# Patient Record
Sex: Male | Born: 1945 | Race: White | Hispanic: No | Marital: Single
Health system: Southern US, Community
[De-identification: ages and names within clinical notes are randomized; demographics above are authoritative.]

---

## 2010-12-08 ENCOUNTER — Ambulatory Visit: Payer: Self-pay

## 2011-04-25 ENCOUNTER — Ambulatory Visit: Payer: Self-pay

## 2020-06-26 ENCOUNTER — Other Ambulatory Visit: Payer: Self-pay

## 2020-06-26 ENCOUNTER — Emergency Department (HOSPITAL_COMMUNITY)
Admission: EM | Admit: 2020-06-26 | Discharge: 2020-06-26 | Disposition: A | Discharge status: Home / Self Care | Payer: Medicare Other | Attending: Emergency Medicine | Admitting: Emergency Medicine

## 2020-06-26 ENCOUNTER — Emergency Department (HOSPITAL_COMMUNITY): Payer: Medicare Other

## 2020-06-26 DIAGNOSIS — S0990XA Unspecified injury of head, initial encounter: Secondary | ICD-10-CM

## 2020-06-26 DIAGNOSIS — W108XXA Fall (on) (from) other stairs and steps, initial encounter: Secondary | ICD-10-CM

## 2020-06-26 DIAGNOSIS — T148XXA Other injury of unspecified body region, initial encounter: Secondary | ICD-10-CM

## 2020-06-26 DIAGNOSIS — S60511A Abrasion of right hand, initial encounter: Secondary | ICD-10-CM | POA: Insufficient documentation

## 2020-06-26 DIAGNOSIS — Y9301 Activity, walking, marching and hiking: Secondary | ICD-10-CM | POA: Diagnosis not present

## 2020-06-26 DIAGNOSIS — Z7901 Long term (current) use of anticoagulants: Secondary | ICD-10-CM | POA: Diagnosis not present

## 2020-06-26 DIAGNOSIS — S0181XA Laceration without foreign body of other part of head, initial encounter: Secondary | ICD-10-CM | POA: Diagnosis not present

## 2020-06-26 LAB — COMPREHENSIVE METABOLIC PANEL
ALT: 41 U/L (ref 0–44)
AST: 74 U/L — ABNORMAL HIGH (ref 15–41)
Albumin: 3 g/dL — ABNORMAL LOW (ref 3.5–5.0)
Alkaline Phosphatase: 125 U/L (ref 38–126)
Anion gap: 5 (ref 5–15)
BUN: 22 mg/dL (ref 8–23)
CO2: 23 mmol/L (ref 22–32)
Calcium: 9 mg/dL (ref 8.9–10.3)
Chloride: 105 mmol/L (ref 98–111)
Creatinine, Ser: 1.17 mg/dL (ref 0.61–1.24)
GFR, Estimated: >60 mL/min (ref >60)
Glucose, Bld: 92 mg/dL (ref 70–99)
Potassium: 4.9 mmol/L (ref 3.5–5.1)
Sodium: 133 mmol/L — ABNORMAL LOW (ref 135–145)
Total Bilirubin: 1.5 mg/dL — ABNORMAL HIGH (ref 0.3–1.2)
Total Protein: 5.2 g/dL — ABNORMAL LOW (ref 6.5–8.1)

## 2020-06-26 LAB — CBC WITH DIFFERENTIAL/PLATELET
Abs Immature Granulocytes: 0.1 10*3/uL — ABNORMAL HIGH (ref 0.00–0.07)
Basophils Absolute: 0.1 10*3/uL (ref 0.0–0.1)
Basophils Relative: 1 %
Eosinophils Absolute: 0.3 10*3/uL (ref 0.0–0.5)
Eosinophils Relative: 3 %
HCT: 28.5 % — ABNORMAL LOW (ref 39.0–52.0)
Hemoglobin: 9.6 g/dL — ABNORMAL LOW (ref 13.0–17.0)
Immature Granulocytes: 1 %
Lymphocytes Relative: 10 %
Lymphs Abs: 0.9 10*3/uL (ref 0.7–4.0)
MCH: 32 pg (ref 26.0–34.0)
MCHC: 33.7 g/dL (ref 30.0–36.0)
MCV: 95 fL (ref 80.0–100.0)
Monocytes Absolute: 1.4 10*3/uL — ABNORMAL HIGH (ref 0.1–1.0)
Monocytes Relative: 14 %
Neutro Abs: 6.8 10*3/uL (ref 1.7–7.7)
Neutrophils Relative %: 71 %
Platelets: 133 10*3/uL — ABNORMAL LOW (ref 150–400)
RBC: 3 MIL/uL — ABNORMAL LOW (ref 4.22–5.81)
RDW: 19.2 % — ABNORMAL HIGH (ref 11.5–15.5)
WBC: 9.5 10*3/uL (ref 4.0–10.5)
nRBC: 0 % (ref 0.0–0.2)

## 2020-06-26 LAB — PROTIME-INR
INR: 2.5 — ABNORMAL HIGH (ref 0.8–1.2)
Prothrombin Time: 25.9 seconds — ABNORMAL HIGH (ref 11.4–15.2)

## 2020-06-26 NOTE — Discharge Instructions (Signed)
Your work-up today included imaging that did not show any acute abnormalities in your head and neck from the trauma.  It did show a thyroid nodule which we discussed.  Please follow-up with your primary team to discuss outpatient ultrasound.  You have a skin tears and abrasions from the fall but otherwise no injuries that would be amenable to suture repair for lacerations.  Please keep the wounds clean and dry and watch for development of infection.  Please follow-up with your primary doctor.  If any symptoms change or worsen acutely, please return to the nearest emergency department.

## 2020-06-26 NOTE — ED Triage Notes (Signed)
Pt BIB [facility]. Pt was walking down steps with walker, fell down 4 steps and hit the front of his head. Pt takes Warfarin. Pt lost consciousness for approximately 1 minute. Denies neck pain, EMS attempted C-collar, pt refused. Pt c/o headache, has a hematoma to top L forehead. Pt was projectile vomiting on scene and in route. Received 4mg  IV zofran.   EMS VS- 125/47, HR 85, SpO2 97% RA, RR 14, CBG 144

## 2020-06-26 NOTE — ED Notes (Signed)
Pt verbalizes understanding of discharge instructions. Opportunity for questions and answers were provided. Pt discharged from the ED.   ?

## 2020-06-26 NOTE — Progress Notes (Signed)
Orthopedic Tech Progress Note Patient Details:  Dustin Cannon 1946/03/13 [id number] Level 2 trauma Patient ID: Dustin Cannon, male   DOB: 1945/10/23, 75 y.o.   MRN: [id number]   Michelle Piper 06/26/2020, 10:49 AM

## 2020-06-26 NOTE — ED Notes (Signed)
Returned from CT.

## 2020-06-26 NOTE — ED Provider Notes (Signed)
[facility] EMERGENCY DEPARTMENT Provider Note   CSN: [id number] Arrival date & time: 06/26/20  1020     History Chief Complaint  Patient presents with  . Fall    Dustin Cannon is a 75 y.o. male.  The history is provided by the patient, medical records and the EMS personnel. No language interpreter was used.  Fall This is a new problem. The current episode started 1 to 2 hours ago. The problem occurs rarely. Associated symptoms include headaches. Pertinent negatives include no chest pain, no abdominal pain and no shortness of breath. Nothing aggravates the symptoms. Nothing relieves the symptoms. He has tried nothing for the symptoms. The treatment provided no relief.       No past medical history on file.  There are no problems to display for this patient.      No family history on file.     Home Medications Prior to Admission medications   Not on File    Allergies    Patient has no allergy information on record.  Review of Systems   Review of Systems  Constitutional: Negative for chills, diaphoresis, fatigue and fever.  HENT: Negative for congestion.   Eyes: Negative for visual disturbance.  Respiratory: Negative for cough, chest tightness and shortness of breath.   Cardiovascular: Negative for chest pain.  Gastrointestinal: Positive for abdominal distention (at baseline). Negative for abdominal pain, constipation, diarrhea, nausea and vomiting.  Genitourinary: Negative for flank pain and frequency.  Musculoskeletal: Negative for back pain, neck pain and neck stiffness.  Neurological: Positive for headaches. Negative for dizziness, seizures, speech difficulty, weakness, light-headedness and numbness.  Psychiatric/Behavioral: Negative for agitation and confusion.  All other systems reviewed and are negative.   Physical Exam Updated Vital Signs There were no vitals taken for this visit.  Physical Exam Vitals and nursing note  reviewed.  Constitutional:      General: He is not in acute distress.    Appearance: He is well-developed. He is not ill-appearing, toxic-appearing or diaphoretic.  HENT:     Mouth/Throat:     Mouth: Mucous membranes are moist.     Pharynx: No oropharyngeal exudate or posterior oropharyngeal erythema.  Eyes:     Extraocular Movements: Extraocular movements intact.     Conjunctiva/sclera: Conjunctivae normal.     Pupils: Pupils are equal, round, and reactive to light.  Cardiovascular:     Rate and Rhythm: Normal rate and regular rhythm.     Heart sounds: No murmur heard.   Pulmonary:     Effort: Pulmonary effort is normal. No respiratory distress.     Breath sounds: Normal breath sounds. No wheezing, rhonchi or rales.  Chest:     Chest wall: No tenderness.  Abdominal:     General: There is distension.     Palpations: Abdomen is soft.     Tenderness: There is no abdominal tenderness. There is no right CVA tenderness, left CVA tenderness, guarding or rebound.     Hernia: A hernia is present.  Musculoskeletal:        General: Signs of injury (abrasion) present. No tenderness.     Cervical back: Neck supple.     Left lower leg: No edema.     Comments: Abrasion to right hand without significant pain.  Normal grip strength, sensation, pulses in extremities.  Skin:    General: Skin is warm and dry.     Capillary Refill: Capillary refill takes less than 2 seconds.     Findings:  Bruising (chronis oer pt) present. No erythema or rash.  Neurological:     General: No focal deficit present.     Mental Status: He is alert.     Sensory: No sensory deficit.     Motor: No weakness.  Psychiatric:        Mood and Affect: Mood normal.     ED Results / Procedures / Treatments   Labs (all labs ordered are listed, but only abnormal results are displayed) Labs Reviewed  CBC WITH DIFFERENTIAL/PLATELET - Abnormal; Notable for the following components:      Result Value   RBC 3.00 (*)     Hemoglobin 9.6 (*)    HCT 28.5 (*)    RDW 19.2 (*)    Platelets 133 (*)    Monocytes Absolute 1.4 (*)    Abs Immature Granulocytes 0.10 (*)    All other components within normal limits  COMPREHENSIVE METABOLIC PANEL - Abnormal; Notable for the following components:   Sodium 133 (*)    Total Protein 5.2 (*)    Albumin 3.0 (*)    AST 74 (*)    Total Bilirubin 1.5 (*)    All other components within normal limits  PROTIME-INR - Abnormal; Notable for the following components:   Prothrombin Time 25.9 (*)    INR 2.5 (*)    All other components within normal limits    EKG EKG Interpretation  Date/Time:  Friday June 26 2020 10:30:35 EST Ventricular Rate:  82 PR Interval:    QRS Duration: 87 QT Interval:  379 QTC Calculation: 443 R Axis:   48 Text Interpretation: Sinus rhythm No prior ECG for comparison. No STEMI Confirmed by Theda Belfast ([id number]) on 06/26/2020 10:34:21 AM   Radiology CT Head Wo Contrast  Result Date: 06/26/2020 CLINICAL DATA:  Larey Seat today, hitting his forehead on concrete. Left forehead abrasion. Denies neck pain or loss of consciousness. EXAM: CT HEAD WITHOUT CONTRAST CT CERVICAL SPINE WITHOUT CONTRAST TECHNIQUE: Multidetector CT imaging of the head and cervical spine was performed following the standard protocol without intravenous contrast. Multiplanar CT image reconstructions of the cervical spine were also generated. COMPARISON:  None. FINDINGS: CT HEAD FINDINGS Brain: No evidence of acute infarction, hemorrhage, hydrocephalus, extra-axial collection or mass lesion/mass effect. Right frontal lobe encephalomalacia. Mild generalized cerebral atrophy. Vascular: No hyperdense vessel or unexpected calcification. Skull: Prior right frontal craniectomy. Negative for fracture or focal lesion. Sinuses/Orbits: No acute finding. Other: Small left frontal scalp hematoma. CT CERVICAL SPINE FINDINGS Alignment: Slight reversal of the normal cervical lordosis. No traumatic  malalignment. Skull base and vertebrae: No acute fracture. No primary bone lesion or focal pathologic process. Soft tissues and spinal canal: No prevertebral fluid or swelling. No visible canal hematoma. Disc levels: Moderate to severe disc height loss throughout the cervical spine, sparing C3-C4. Advanced bilateral uncovertebral hypertrophy at C5-C6 and C6-C7. Ankylosis of the left C4-C5 facet joint. Upper chest: Mild centrilobular emphysema. Small amount of secretions within the trachea. Other: 2.2 cm heterogeneous nodule in the posterior left thyroid lobe (series 9, image 77). IMPRESSION: CT head: 1. No acute intracranial abnormality. Small left frontal scalp hematoma. 2. 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 No acute cervical spine fracture or traumatic listhesis. 2. 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 3. 2.2 cm heterogeneous nodule in the posterior left thyroid lobe. Recommend thyroid US (ref: J Am Coll Radiol. 2015 Feb;12(2): 143-50). 4. Emphysema (ICD10-J43.9). Electronically Signed   By: Chrissie Noa  Howell Pringle Derry M.D.   On: 06/26/2020 11:41   CT Cervical Spine Wo Contrast  Result Date: 06/26/2020 CLINICAL DATA:  Larey SeatFell today, hitting his forehead on concrete. Left forehead abrasion. Denies neck pain or loss of consciousness. EXAM: CT HEAD WITHOUT CONTRAST CT CERVICAL SPINE WITHOUT CONTRAST TECHNIQUE: Multidetector CT imaging of the head and cervical spine was performed following the standard protocol without intravenous contrast. Multiplanar CT image reconstructions of the cervical spine were also generated. COMPARISON:  None. FINDINGS: CT HEAD FINDINGS Brain: No evidence of acute infarction, hemorrhage, hydrocephalus, extra-axial collection or mass lesion/mass effect. Right frontal lobe encephalomalacia. Mild generalized cerebral atrophy. Vascular: No hyperdense vessel or unexpected calcification. Skull: Prior right frontal  craniectomy. Negative for fracture or focal lesion. Sinuses/Orbits: No acute finding. Other: Small left frontal scalp hematoma. CT CERVICAL SPINE FINDINGS Alignment: Slight reversal of the normal cervical lordosis. No traumatic malalignment. Skull base and vertebrae: No acute fracture. No primary bone lesion or focal pathologic process. Soft tissues and spinal canal: No prevertebral fluid or swelling. No visible canal hematoma. Disc levels: Moderate to severe disc height loss throughout the cervical spine, sparing C3-C4. Advanced bilateral uncovertebral hypertrophy at C5-C6 and C6-C7. Ankylosis of the left C4-C5 facet joint. Upper chest: Mild centrilobular emphysema. Small amount of secretions within the trachea. Other: 2.2 cm heterogeneous nodule in the posterior left thyroid lobe (series 9, image 77). IMPRESSION: CT head: 1. No acute intracranial abnormality. Small left frontal scalp hematoma. 2. 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 No acute cervical spine fracture or traumatic listhesis. 2. 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 3. 2.2 cm heterogeneous nodule in the posterior left thyroid lobe. Recommend thyroid US (ref: J Am Coll Radiol. 2015 Feb;12(2): 143-50). 4. Emphysema (ICD10-J43.9). Electronically Signed   By: Obie DredgeWilliam T Derry M.D.   On: 06/26/2020 11:41    Procedures Procedures   Medications Ordered in ED Medications - No data to display  ED Course  I have reviewed the triage vital signs and the nursing notes.  Pertinent labs & imaging results that were available during my care of the patient were reviewed by me and considered in my medical decision making (see chart for details).    MDM Rules/Calculators/A&P                          Dustin Snooksimothy Thal is a 75 y.o. 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  According to EMS, patient fell down approximately 3 stairs and struck the front of his head on the ground.  He was allegedly unconscious for a minute or 2 and then when EMS saw him, he was projectile vomiting and transfer.  He was given 4 of Zofran and has had improvement in nausea.  He is reporting mild to moderate headache.  He denies other complaints including no extremity symptoms with numbness, tingling, weakness.  Denies any speech difficulties or vision changes.  He is unsure if his tetanus is up-to-date.  We will try to look through the chart to determine.  Patient has some bleeding on his forehead.  He has no complaint of severe neck pain, chest pain, back pain or abdominal pain.  On arrival, airway is intact and breath sounds are equal bilaterally.  Chest and abdomen are nontender.  Neck is nontender.  Back is  nontender.  Patient has some bruising on extremities and has a skin tear/abrasion on his forehead and upper scalp.  Pupils are symmetric and reactive normal extraocular movements.  Speech is clear.  Normal sensation and strength in extremities.  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  It is reducible and nontender.  Normal bowel sounds.  Patient also has a surgical dressing on his right upper quadrant that appears to be intact and well-appearing.  No back tenderness or flank tenderness.  We will get a CT head and neck to evaluate for intracranial bleeding or injury.  We will get screening labs including an INR.  Anticipate reassessment for work-up.  Imaging was reassuring.  Labs do not appear to show any acute abnormalities that require intervention at this time.  Patient was told about a thyroid nodule that was discovered.  He can follow-up with his PCP for this for ultrasound.  Patient agrees.  He was reassessed after cleaning his scalp wound and it appears to be a abrasion with no areas that would be amenable to suture  placement.  Patient will follow up with his PCP and understood return precautions.  He noted questions or concerns and was discharged in good condition.   Final Clinical Impression(s) / ED Diagnoses Final diagnoses:  Abrasion  Fall down stairs, initial encounter  Scalp injury, initial encounter  Multiple skin tears    Rx / DC Orders ED Discharge Orders    None     . Clinical Impression: 1. Abrasion   2. Fall down stairs, initial encounter   3. Scalp injury, initial encounter   4. Multiple skin tears     Disposition: Discharge  Condition: Good  I have discussed the results, Dx and Tx plan with the pt(& family if present). He/she/they expressed understanding and agree(s) with the plan. Discharge instructions discussed at great length. Strict return precautions discussed and pt &/or family have verbalized understanding of the instructions. No further questions at time of discharge.    New Prescriptions   No medications on file    Follow Up: Your primary team     [facility] EMERGENCY DEPARTMENT [address] [id number] mc [place] [address] [phone]       Valerio Pinard, Canary Brim, MD 06/26/20 [phone]

## 2020-06-26 NOTE — ED Notes (Signed)
Patient transported to CT 

## 2020-10-16 DEATH — deceased

## 2022-02-28 IMAGING — CT CT HEAD W/O CM
3 series · 14 of 47 positions shown, 16 images · non-contrast
Comparison: None.

CLINICAL DATA: Fell today, hitting his forehead on concrete. Left
forehead abrasion. Denies neck pain or loss of consciousness.

EXAM:
CT HEAD WITHOUT CONTRAST
CT CERVICAL SPINE WITHOUT CONTRAST
TECHNIQUE: Multidetector CT imaging of the head and cervical spine was
performed following the standard protocol without intravenous
contrast. Multiplanar CT image reconstructions of the cervical spine
were also generated.

[Series 3: head 5.0 h30s · axial · 0.48mm/px · z∈[-126,+9]mm · 8 of 33 slices shown, 10 images]
[im 3/33  brain]
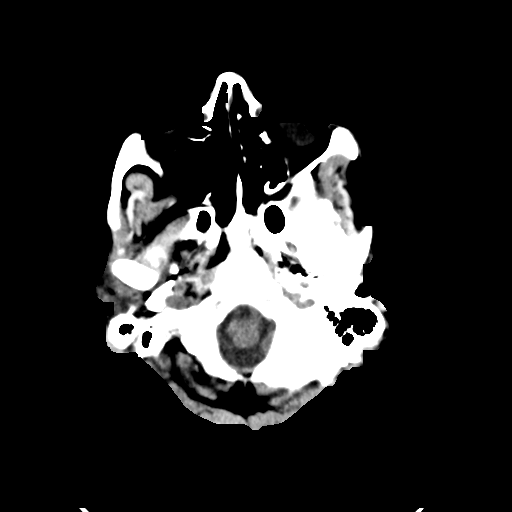
[im 3/33  bone]
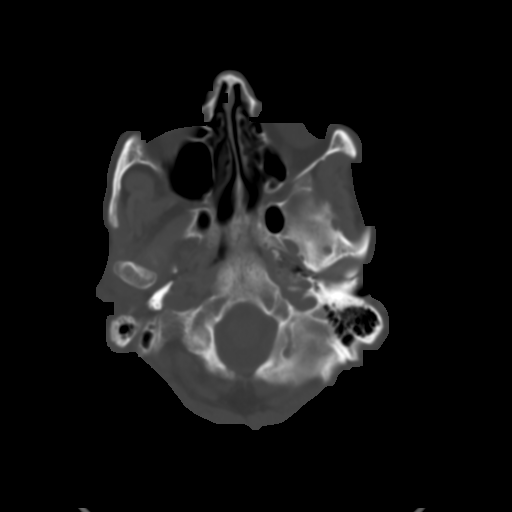
[im 7/33  brain]
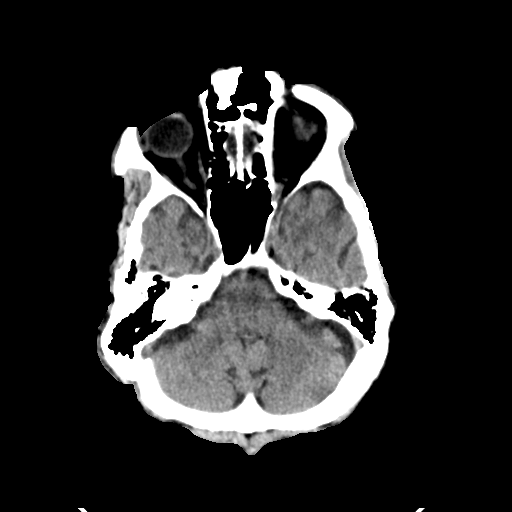
[im 10/33  brain]
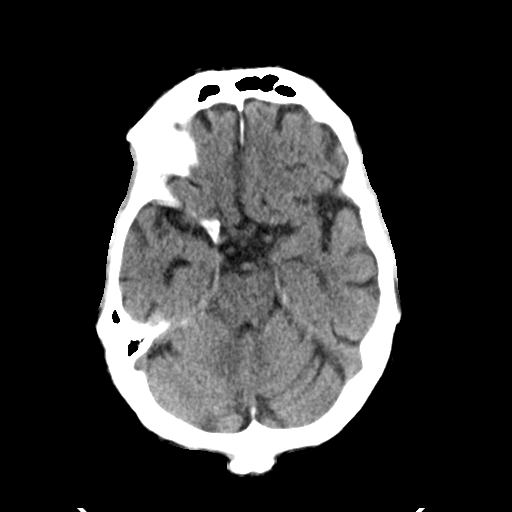
[im 15/33  brain]
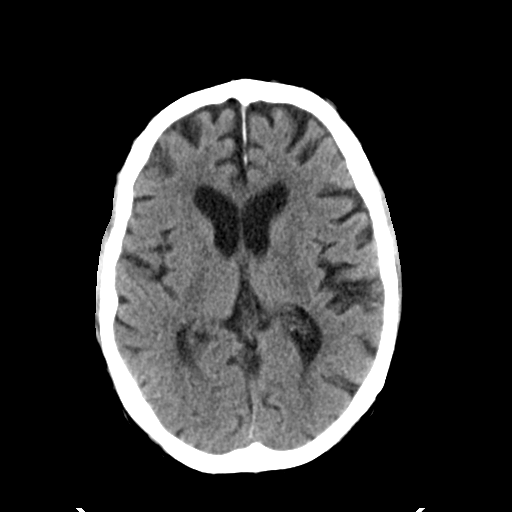
[im 18/33  brain]
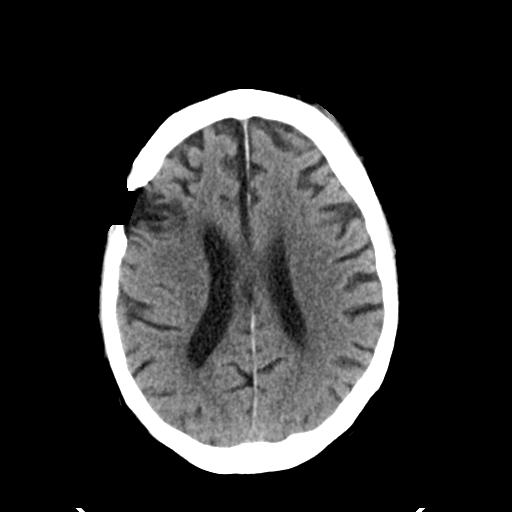
[im 18/33  bone]
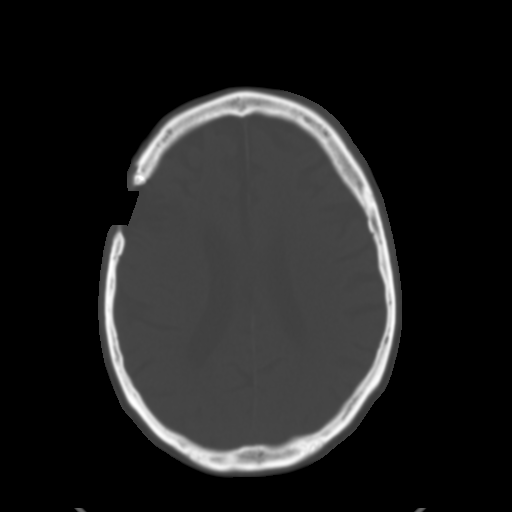
[im 23/33  brain]
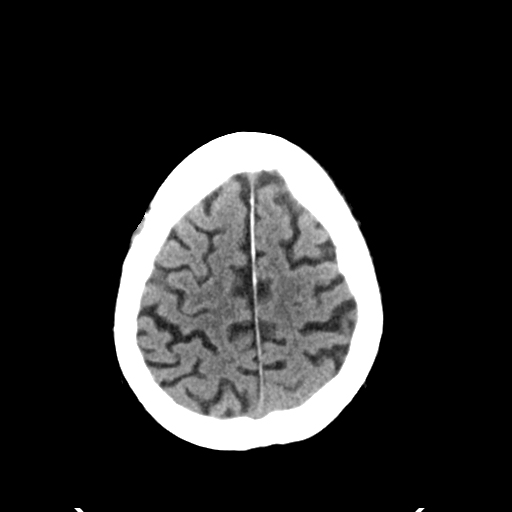
[im 26/33  brain]
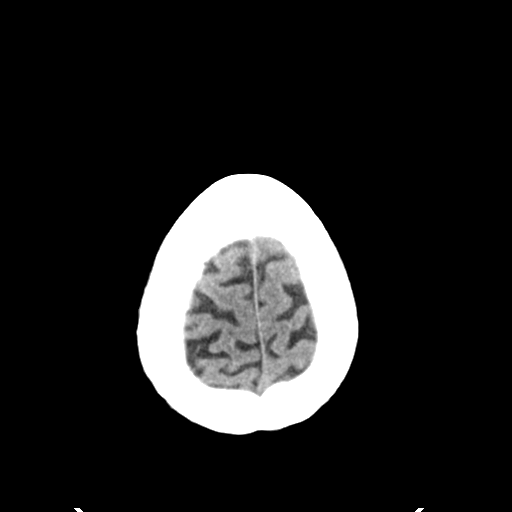
[im 30/33  brain]
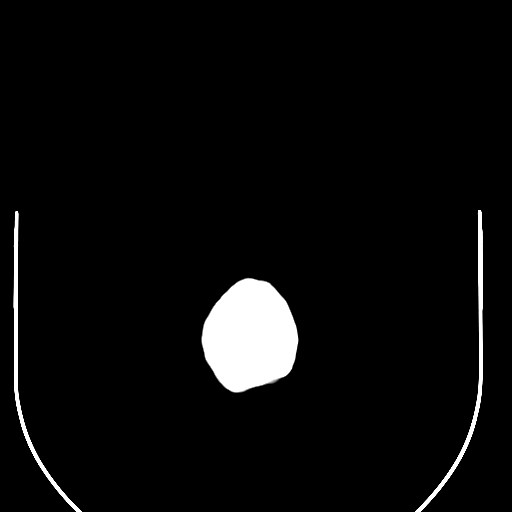

[Series 7: head 3.0 mpr cor · coronal · 0.33mm/px · 3 of 76 slices shown]
[im 26/76  brain]
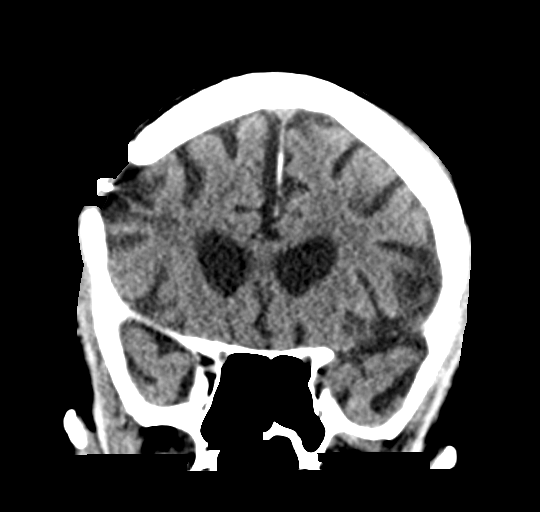
[im 34/76  brain]
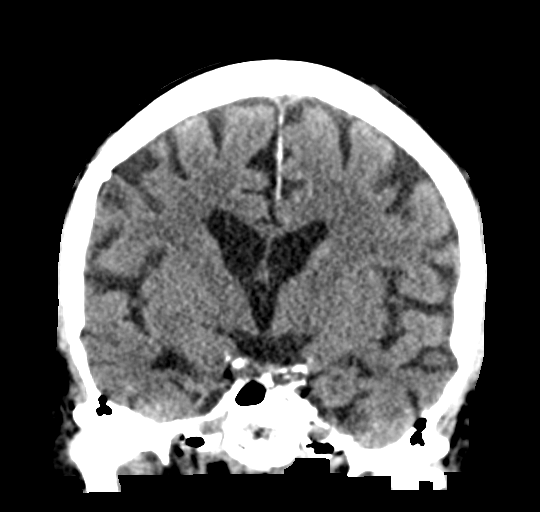
[im 42/76  brain]
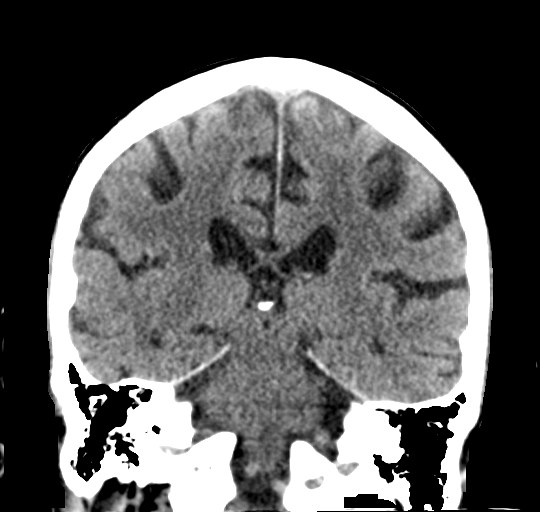

[Series 8: head 3.0 mpr sag · sagittal · 0.36mm/px · 3 of 67 slices shown]
[im 23/67  brain]
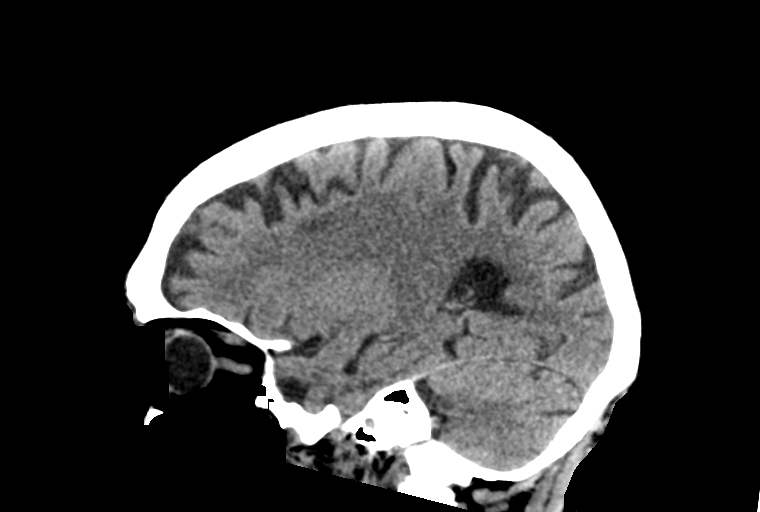
[im 34/67  brain]
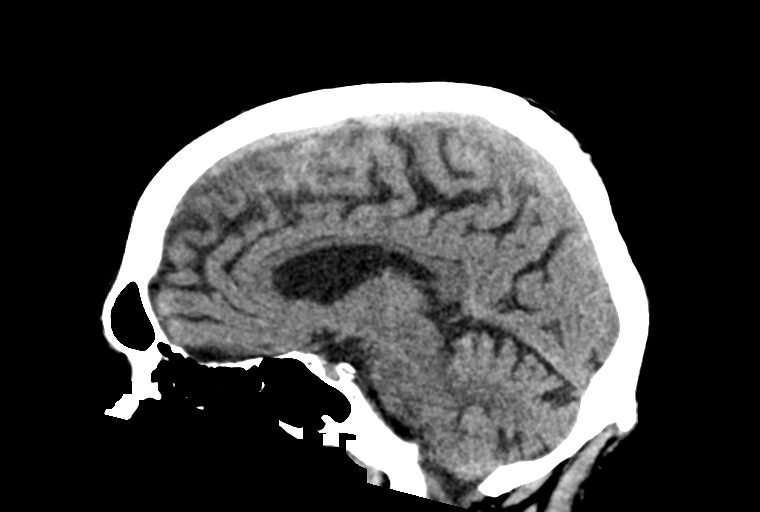
[im 45/67  brain]
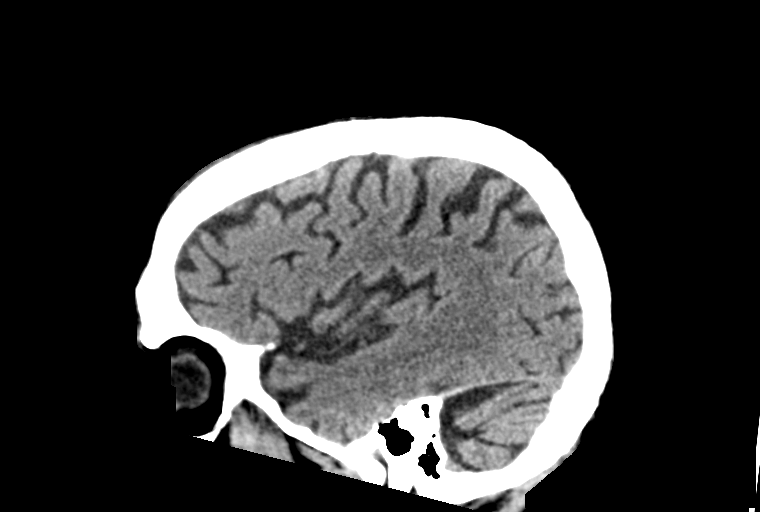

[14 of 47 positions shown; findings below may reference images not displayed]

FINDINGS: CT HEAD FINDINGS

Brain: No evidence of acute infarction, hemorrhage, hydrocephalus,
extra-axial collection or mass lesion/mass effect. Right frontal
lobe encephalomalacia. Mild generalized cerebral atrophy.

Vascular: No hyperdense vessel or unexpected calcification.

Skull: Prior right frontal craniectomy. Negative for fracture or
focal lesion.

Sinuses/Orbits: No acute finding.

Other: Small left frontal scalp hematoma.

CT CERVICAL SPINE FINDINGS

Alignment: Slight reversal of the normal cervical lordosis. No
traumatic malalignment.

Skull base and vertebrae: No acute fracture. No primary bone lesion
or focal pathologic process.

Soft tissues and spinal canal: No prevertebral fluid or swelling. No
visible canal hematoma.

Disc levels: Moderate to severe disc height loss throughout the
cervical spine, sparing C3-C4. Advanced bilateral uncovertebral
hypertrophy at C5-C6 and C6-C7. Ankylosis of the left C4-C5 facet
joint.

Upper chest: Mild centrilobular emphysema. Small amount of
secretions within the trachea.

Other: 2.2 cm heterogeneous nodule in the posterior left thyroid
lobe (series 9, image 77).
IMPRESSION: CT head:

1. No acute intracranial abnormality. Small left frontal scalp
hematoma.
2. Right frontal lobe encephalomalacia with overlying craniectomy
defect

CT cervical spine:

1. No acute cervical spine fracture or traumatic listhesis.
2. Moderate to severe degenerative changes throughout the cervical
spine.
3. 2.2 cm heterogeneous nodule in the posterior left thyroid lobe.
Recommend thyroid US (ref: [HOSPITAL]. [DATE]):
4. Emphysema ([id number]).
